# Patient Record
Sex: Female | Born: 1935 | Race: White | Hispanic: No | Smoking: Never smoker
Health system: Southern US, Community
[De-identification: ages and names within clinical notes are randomized; demographics above are authoritative.]

## PROBLEM LIST (undated history)

## (undated) HISTORY — PX: ABDOMINAL HYSTERECTOMY: SHX81

---

## 1997-12-03 ENCOUNTER — Emergency Department (HOSPITAL_COMMUNITY): Admission: EM | Admit: 1997-12-03 | Discharge: 1997-12-03 | Payer: Self-pay | Admitting: Emergency Medicine

## 2002-06-28 ENCOUNTER — Inpatient Hospital Stay (HOSPITAL_COMMUNITY): Admission: EM | Admit: 2002-06-28 | Discharge: 2002-06-29 | Payer: Self-pay | Admitting: Emergency Medicine

## 2002-06-28 ENCOUNTER — Encounter: Payer: Self-pay | Admitting: Emergency Medicine

## 2002-06-28 ENCOUNTER — Encounter: Payer: Self-pay | Admitting: Cardiology

## 2002-06-29 ENCOUNTER — Encounter: Payer: Self-pay | Admitting: Internal Medicine

## 2002-06-29 ENCOUNTER — Encounter: Payer: Self-pay | Admitting: Cardiology

## 2003-02-20 ENCOUNTER — Encounter (INDEPENDENT_AMBULATORY_CARE_PROVIDER_SITE_OTHER): Payer: Self-pay | Admitting: *Deleted

## 2003-02-20 ENCOUNTER — Ambulatory Visit (HOSPITAL_COMMUNITY): Admission: RE | Admit: 2003-02-20 | Discharge: 2003-02-20 | Payer: Self-pay | Admitting: *Deleted

## 2004-09-26 ENCOUNTER — Ambulatory Visit (HOSPITAL_COMMUNITY): Admission: RE | Admit: 2004-09-26 | Discharge: 2004-09-26 | Payer: Self-pay | Admitting: *Deleted

## 2004-09-26 ENCOUNTER — Encounter (INDEPENDENT_AMBULATORY_CARE_PROVIDER_SITE_OTHER): Payer: Self-pay | Admitting: Specialist

## 2006-02-26 ENCOUNTER — Encounter: Admission: RE | Admit: 2006-02-26 | Discharge: 2006-02-26 | Payer: Self-pay

## 2006-08-10 ENCOUNTER — Ambulatory Visit (HOSPITAL_COMMUNITY): Admission: RE | Admit: 2006-08-10 | Discharge: 2006-08-10 | Payer: Self-pay | Admitting: *Deleted

## 2006-08-10 ENCOUNTER — Encounter (INDEPENDENT_AMBULATORY_CARE_PROVIDER_SITE_OTHER): Payer: Self-pay | Admitting: *Deleted

## 2010-06-05 ENCOUNTER — Emergency Department (HOSPITAL_COMMUNITY): Admission: EM | Admit: 2010-06-05 | Discharge: 2010-06-05 | Payer: Self-pay | Admitting: Emergency Medicine

## 2010-11-05 LAB — CBC
HCT: 41.1 % (ref 36.0–46.0)
MCH: 29.7 pg (ref 26.0–34.0)
MCHC: 33.3 g/dL (ref 30.0–36.0)
MCV: 89.2 fL (ref 78.0–100.0)
RDW: 13.8 % (ref 11.5–15.5)

## 2010-11-05 LAB — BASIC METABOLIC PANEL
BUN: 18 mg/dL (ref 6–23)
CO2: 24 mEq/L (ref 19–32)
GFR calc non Af Amer: 57 mL/min — ABNORMAL LOW (ref >60)
Glucose, Bld: 97 mg/dL (ref 70–99)
Potassium: 4.2 mEq/L (ref 3.5–5.1)

## 2010-11-05 LAB — POCT CARDIAC MARKERS: Myoglobin, poc: 62.5 ng/mL (ref 12–200)

## 2011-01-09 NOTE — Op Note (Signed)
   NAMEMARIELLA, Beth Ferguson                            ACCOUNT NO.:  [id number]   MEDICAL RECORD NO.:  [id number]                   PATIENT TYPE:  AMB   LOCATION:  ENDO                                 FACILITY:  [facility]   PHYSICIAN:  Georgiana Spinner, M.D.                 DATE OF BIRTH:  1935-09-08   DATE OF PROCEDURE:  DATE OF DISCHARGE:                                 OPERATIVE REPORT   PROCEDURE:  Upper endoscopy.   INDICATIONS:  Hemoccult positivity.   ANESTHESIA:  Demerol 75 mg, Versed 7.5 mg.   DESCRIPTION OF PROCEDURE:  With the patient mildly sedated in the left  lateral decubitus position, the Olympus videoscopic endoscope was inserted  in the mouth and passed under direct vision through the esophagus which  showed Barrett's esophagus, photographed and biopsied.  There was blood in  the hiatal hernia sac that was bleeding into the stomach.  It was died,  black, but also some seen in the fundus which otherwise appeared normal and  was photographed.  Body and antrum also appeared normal as did the duodenal  bulb and second portion of the duodenum.  From this point, the endoscope was  slowly withdrawn taking circumferential views of the duodenal mucosa until  the endoscope then pulled back into the stomach, placed in retroflexion and  viewed the stomach from below.  Hiatal hernia was seen an incomplete wrap of  the GE junction around the endoscope.  The endoscope was then straightened,  withdrawn, taking circumferential views of the remaining gastric and  esophageal mucosa. The patient's vital signs and pulse oximetry remained  stable.  The patient tolerated the procedure well without apparent  complication.   FINDINGS:  Short segment Barrett's esophagus above a hiatal hernia with  blood seen in the stomach and hiatal hernia sac which very well may be the  cause of the patient's hemoccult positivity.   PLAN:  Await biopsy report.  Consideration for reflux therapy and I will  have  the patient follow up with me for the results of the biopsies as an  outpatient.  Proceed to colonoscopy as planned.                                                Georgiana Spinner, M.D.    GMO/MEDQ  D:  02/20/2003  T:  02/20/2003  Job:  [id number]

## 2011-01-09 NOTE — Op Note (Signed)
Beth Ferguson, Beth Ferguson                ACCOUNT NO.:  [id number]   MEDICAL RECORD NO.:  [id number]          PATIENT TYPE:  AMB   LOCATION:  ENDO                         FACILITY:  [facility]   PHYSICIAN:  Georgiana Spinner, M.D.    DATE OF BIRTH:  01-19-1936   DATE OF PROCEDURE:  08/10/2006  DATE OF DISCHARGE:                               OPERATIVE REPORT   PROCEDURE:  Upper endoscopy.   INDICATIONS:  Gastroesophageal reflux disease.   ANESTHESIA:  Fentanyl 60 mcg, Versed 6 mg.   DESCRIPTION OF PROCEDURE:  With the patient mildly sedated in the left  lateral decubitus position, the Pentax videoscopic endoscope was  inserted in the mouth and passed under direct vision through the  esophagus which appeared normal until we reached the distal esophagus  and there was a question of Barrett's and/or irritation of the distal  esophagus which we attempted to photograph but we did biopsy.  We  entered into the stomach, the fundus, body, antrum, duodenal bulb, and  second portion duodenum were visualized. From this point the endoscope  was slowly withdrawn taking circumferential views of the duodenal mucosa  until the endoscope had been pulled back into the stomach and placed in  retroflexion to view the stomach from below.  A hiatal hernia was noted  and photographed. The endoscope was straightened and withdrawn.  The  patient's vital signs and pulse oximeter remained stable.  The patient  tolerated the procedure well without apparent complications.   FINDINGS:  Small hiatal hernia with some irritation, biopsied.  Await  biopsy report.  The patient will call me for results and follow-up with  me as an outpatient.           ______________________________  Georgiana Spinner, M.D.     GMO/MEDQ  D:  08/10/2006  T:  08/10/2006  Job:  [id number]

## 2011-01-09 NOTE — Op Note (Signed)
Beth Ferguson, Beth Ferguson                ACCOUNT NO.:  [id number]   MEDICAL RECORD NO.:  [id number]          PATIENT TYPE:  AMB   LOCATION:  ENDO                         [facility]AC[facility]  [facility]   PHYSICIAN:  Georgiana Spinner, M.D.    DATE OF BIRTH:  [birth year]   DATE OF PROCEDURE:  09/26/2004  DATE OF DISCHARGE:                                 OPERATIVE REPORT   PROCEDURE:  Upper endoscopy with biopsy.   INDICATIONS FOR PROCEDURE:  Gastroesophageal reflux disease.   ANESTHESIA:  Demerol 50, Versed 5 mg.   DESCRIPTION OF PROCEDURE:  With the patient mildly sedated in the left  lateral decubitus position, the Olympus videoscopic endoscope was inserted  in the mouth, passed under direct vision through the esophagus which  appeared normal until we reached the distal esophagus and there was a  question of Barrett's photographed and biopsied.  We entered into the  stomach. The fundus, body, antrum, duodenal bulb and second portion of the  duodenum were visualized. From this point, the endoscope was slowly  withdrawn taking circumferential views of the duodenal mucosa until the  endoscope had been pulled back in the stomach, placed in retroflexion to  view the stomach from below and a loose wrap of the gastroesophageal  junction around the endoscope was noted. The endoscope was then straightened  and withdrawn taking circumferential views of the remaining gastric and  esophageal mucosa stopping only to biopsy the area of questioned Barrett's.  The patient's vital signs and pulse oximeter remained stable.  The patient  tolerated the procedure well without apparent complications.   FINDINGS:  Question of Barrett's esophagus above a loosely wrapped  gastroesophageal junction.  Await biopsy report. The patient will call me  for results and followup with me as an outpatient and once again the patient  was not taking medication protonix as we suggested and will once again  enforce that with her.      GMO/MEDQ  D:  09/26/2004  T:  09/26/2004  Job:  [id number]

## 2011-01-09 NOTE — H&P (Signed)
Beth Ferguson, Beth Ferguson                            ACCOUNT NO.:  [id number]   MEDICAL RECORD NO.:  [id number]                    PATIENT TYPE:   LOCATION:                                       FACILITY:  [facility]   PHYSICIAN:  Learta Codding, M.D. [facility]             DATE OF BIRTH:  [birth year]   DATE OF ADMISSION:  06/28/2002  DATE OF DISCHARGE:                                HISTORY & PHYSICAL   CARDIOLOGIST:  New -- Dr. Vernie Shanks. [place].   CURRENT COMPLAINT:  Substernal chest pressure and shortness of breath over  the last several weeks, worsened today.   HISTORY OF PRESENT ILLNESS:  The patient is a 75 year old white female with  no significant past medical history, with multiple risk factors for coronary  artery disease.  The patient reports a several-week history of decrease in  exercise tolerance associated with increased chest tightness and shortness  of breath upon exertion.  The patient works at [employer] and when walking from  the parking lot to her office, which is up a ramp, she has been experiencing  increased shortness of breath and chest tightness.  Today, the patient, when  she walked up to her second floor condominium, felt that she was having a  heart attack at the top of the stairs.  She was markedly dyspneic.  After  sitting down for 10 to 15 minutes, her symptoms resolved.  After discussing  this with her daughters, the patient then decided to come to the emergency  room for further evaluation.  In the ER, electrocardiogram is within normal  limits and cardiac enzymes are currently still pending.  The patient denies  any symptoms of orthopnea and PND.  She denies palpitations or syncope.  The  patient's two daughter's are with her and one of her daughters is actually a  former patient of Dr. Duke Salvia, now followed by Dr. Terrilee Croak at  [facility] and in the past also by Dr. Kathe Becton.  Her daughter tells me that  she has a complicated history of atrial fibrillation with  several ablations  and then currently has a pacemaker in place.  She has subsequently requested  our services for her mother's care.   ALLERGIES:  The patient has a severe IVP DYE allergy.   MEDICATIONS:  None.   FAMILY HISTORY:  Family history positive for coronary artery disease; the  patient's brother died at age 60 of myocardial infarction and her father  died at age 38 of a myocardial infarction.  She has a daughter who has  atrial fibrillation and is status post multiple ablations and procedures as  outlined above.   SOCIAL HISTORY:  The patient lives by herself.  She lives in [place].  She works at [employer].  She has a former history of tobacco use; she smoked  about a pack a day between the ages of 36  and 44.   REVIEW OF SYSTEMS:  As per HPI.  No nausea or vomiting.  No fever or chills.  No abdominal pain.  No claudication.  No syncope.   PHYSICAL EXAMINATION:  VITAL SIGNS:  Blood pressure is 145/68.  Heart rate  is 97 beats per minute.  Temperature is 97.9.  GENERAL:  A well-nourished white female in no apparent distress.  HEENT:  Pupils isocoric.  Conjunctivae clear.  NECK:  Neck supple.  Normal carotid upstroke.  No carotid bruits.  LUNGS:  Lungs are clear.  HEART:  Regular rate and rhythm.  Normal S1 and S2.  No murmurs, rubs, or  gallops.  ABDOMEN:  Abdomen is soft and nontender.  No rebound or guarding.  Good  bowel sounds.  EXTREMITIES:  Peripheral pulses 2+.  No cyanosis or clubbing or edema.   LABORATORY AND ACCESSORY CLINICAL DATA:  Twelve-lead EKG:  Normal sinus  rhythm, no acute ischemic changes.   Chest x-ray:  No acute abnormalities.   Labs are currently still pending.   IMPRESSION AND PLAN:  1. Chest tightness with shortness of breath.  The patient has several risk     factors for coronary artery disease.  Her symptoms are rather typical for     exertional angina and I suspect the patient has significant underlying     heart disease.  I had a long  discussion with the patient about her     symptoms and the various ways that we can further evaluate her; this was     also discussed carefully with the patient's daughters and I have     recommended to proceed with a cardiac catheterization to rule out     ischemic heart disease.  I have carefully discussed the risks and     benefits of the procedure and both the patient and her daughters are     comfortable with the risks associated with the cardiac catheterization.     The patient will be admitted and she will be given aspirin and Lovenox as     well as beta blocker therapy.  Plavix will also be added to her medical     regimen.  2. Intravenous pyelogram dye allergy.  The patient will be pretreated with     prednisone, Benadryl and Zantac, pretreatment started at 2 o'clock in the     morning and anticipate the patient should be ready for a cardiac     catheterization after noon.  3. Rule out hypertension.  The patient's blood pressure is elevated.  She     will be started on a beta blocker.  She may need long-term medical     therapy.   DISPOSITION:  Plan for cardiac catheterization later in the afternoon after  complete treatment for prevention of anaphylactoid reaction.                                               Learta Codding, M.D. [facility]    GED/MEDQ  D:  06/28/2002  T:  06/28/2002  Job:  [id number]

## 2011-01-09 NOTE — Op Note (Signed)
   NAMEMALINDA, MAYDEN                            ACCOUNT NO.:  [id number]   MEDICAL RECORD NO.:  [id number]                   PATIENT TYPE:  AMB   LOCATION:  ENDO                                 FACILITY:  [facility]   PHYSICIAN:  Georgiana Spinner, M.D.                 DATE OF BIRTH:  [birth year]   DATE OF PROCEDURE:  02/20/2003  DATE OF DISCHARGE:                                 OPERATIVE REPORT   PROCEDURE:  Colonoscopy.   ENDOSCOPIST:  Georgiana Spinner, M.D.   INDICATIONS FOR PROCEDURE:  The patient is undergoing a colonoscopy because  of Hemoccult positivity.   ANESTHESIA:  None further given.   DESCRIPTION OF PROCEDURE:  With the patient mildly sedated in the left  lateral decubitus position, the Olympus videoscopic colonoscope was inserted  into the rectum and advanced under direct vision to the cecum, identified by  the ileocecal valve and appendiceal orifice, both of which were photographed  from this point.  The colonoscope was slowly withdrawn, taking  circumferential views of the entire colonic mucosa, stopping only in the  rectum where a polyp was seen, photographed, and removed using hot biopsy  forceps technique set at a 20-point blended current.  In retroflexed view  there were internal hemorrhoids seen and photographed.  The endoscope was  straightened and withdrawn.  The patient's vital signs and pulse oximeter remained stable.  The patient  tolerated the procedure well without apparent complications.   FINDINGS:  1. Polyp from rectum, removed.  2. Internal hemorrhoids.   PLAN:  Await the biopsy report.  The patient will call for the results, and  follow up with me as an outpatient.                                                Georgiana Spinner, M.D.    GMO/MEDQ  D:  02/20/2003  T:  02/20/2003  Job:  [id number]

## 2011-01-09 NOTE — Cardiovascular Report (Signed)
NAMEEFRATA, BRUNNER                            ACCOUNT NO.:  [id number]   MEDICAL RECORD NO.:  [id number]                   PATIENT TYPE:  INP   LOCATION:  6529                                 FACILITY:  [facility]   PHYSICIAN:  Jonelle Sidle, M.D. [facility]        DATE OF BIRTH:  [birth year]   DATE OF PROCEDURE:  DATE OF DISCHARGE:                              CARDIAC CATHETERIZATION   PRIMARY CARE PHYSICIAN:  Soyla Murphy. Renne Crigler, M.D.   Corinda Gubler CARDIOLOGIST:  Learta Codding, M.D.   INDICATIONS FOR PROCEDURE:  The patient is a pleasant 75 year old woman with  a history of panic attacks and possible hypertension who presents with a  progressive several-week history of increasing dyspnea on exertion with  intermittent chest pressure.  She has ruled out for myocardial infarction  and following contrast dye prophylaxis given a previous allergy, she is  referred for coronary angiography to clearly define the coronary anatomy.   PROCEDURE PERFORMED:  1. Left heart catheterization.  2. Right heart catheterization.  3. Selective coronary angiography.  4. Left ventriculography.   ACCESS AND EQUIPMENT:  The area about the right femoral artery and vein was  anesthetized with 1% lidocaine and a #6 French sheath was placed in the  right femoral artery via the modified Seldinger technique.  A #8 French  sheath was placed in the right femoral vein via the modified Seldinger  technique.  Standard preformed #6 [place] and JR4 catheters were used for  selective coronary angiography and a #6 French angled pigtail catheter was  used for left heart catheterization and left ventriculography.  A #7 French  balloon-tipped flow-directed catheter was used for right heart  catheterization and hemodynamic assessment.  All exchanges were made over a  wire except for the flow-directed catheter, and the patient tolerated the  procedure well without immediate complications.   HEMODYNAMICS:  1. Right atrium:  13 A  wave, 11 V wave,  9 mean.  2. RV:  26 A wave, 7 V wave, 11 mean.  3. Pulmonary artery:  27 A wave, 14 V wave, 21 mean.  4. Pulmonary capillary wedge pressure:  17 A wave, 14 V wave, 13 mean.  5. Left ventricular:  99/15 mmHg.  6. Aorta:  99/56 mmHg.  7. Cardiac output 5.7 liters/minute.  Cardiac index 3.3.  8. Arterial saturation 95%.  Pulmonary artery saturation 75%.   ANGIOGRAPHIC FINDINGS:  1. The left main coronary artery is free of significant flow-limiting     coronary atherosclerosis.  2. The left anterior descending is a medium caliber vessel that tapers     distally.  There are three small diagonal branches.  There is a large     septal perforator that appears to have a 20% stenosis at its ostium     although this may be simply a developmental finding.  There is no flow-     limiting stenoses within this system.  3. The circumflex coronary artery is  large vessel with seven obtuse     marginal branches.  There is no significant flow-limiting coronary     atherosclerosis within this system.  4. The right coronary artery is a dominant vessel.  There is a 20% ostial     stenosis without any flow-limiting coronary atherosclerosis noted.  5. Left ventriculography is performed in the RAO projection and revealed an     ejection fraction of 70-75% with no focal wall motion abnormalities and     no significant mitral regurgitation.   DIAGNOSES:  1. No flow-limiting coronary atherosclerosis noted within the major     epicardial vessels.  2. Left ventricular ejection fraction of 70-75% without significant mitral     regurgitation.  3. Normal right heart hemodynamics with a normal pulmonary artery pressure,     normal pulmonary capillary wedge pressure, and a normal cardiac output.   RECOMMENDATIONS:  I have discussed the case with Dr. Andee Lineman.  Will plan to  screen the patient with a spiral CT scan of the chest in the morning after  further contrast dye prophylaxis to exclude the  possibilities of pulmonary  embolus or other parenchymal lung disease.                                               Jonelle Sidle, M.D. [facility]    SGM/MEDQ  D:  06/28/2002  T:  06/28/2002  Job:  [id number]

## 2011-01-09 NOTE — Op Note (Signed)
NAMEKHALI, PERELLA                ACCOUNT NO.:  [id number]   MEDICAL RECORD NO.:  [id number]          PATIENT TYPE:  AMB   LOCATION:  ENDO                         FACILITY:  [facility]   PHYSICIAN:  Georgiana Spinner, M.D.    DATE OF BIRTH:  [birth year]   DATE OF PROCEDURE:  DATE OF DISCHARGE:                               OPERATIVE REPORT   PROCEDURE:  Colonoscopy.   INDICATIONS:  Colon polyp.   ANESTHESIA:  Fentanyl 40 mcg, Versed 3 mg.   DESCRIPTION OF PROCEDURE:  With patient mildly sedated in the left  lateral decubitus position the Pentax videoscopic colonoscope was  inserted into the rectum, passed under direct vision with pressure  applied to the abdomen and reached the cecum as identified by ileocecal  valve and appendiceal orifice, both of which were photographed.  From  this point the colonoscope was slowly withdrawn.  Taking circumferential  views of the colonic mucosa stopping in the ascending colon about two  folds removed from the ileocecal valve, which a lipoma was seen and  photographed only.  The endoscope was then withdrawn all the way to  approximately 25 cm from the anal verge at which point a flat polyp was  noted.  This was photographed and it was removed first using snare  cautery technique setting 20/200 blended current.  There was a small  amount of residual polypoid tissue left and using the hot biopsy forceps  with the same setting I removed the remainder of the tissue, placed them  both in the same specimen container.  The endoscope was then withdrawn  to the rectum, which appeared normal on direct and showed hemorrhoids on  retroflex view.  The endoscope was straightened and withdrawn.  The  patient's vital signs and pulse oximetry remained stable.  The patient  tolerated the procedure well without apparent complications.   FINDINGS:  Small polyp at 25 cm from the anal verge, internal  hemorrhoids.  Otherwise an unremarkable examination.   PLAN:  Await  biopsy reports.  The patient will call me for results and  follow up with me as an outpatient.           ______________________________  Georgiana Spinner, M.D.     GMO/MEDQ  D:  08/10/2006  T:  08/11/2006  Job:  [id number]

## 2011-01-09 NOTE — Discharge Summary (Signed)
   Beth Ferguson, Beth Ferguson                            ACCOUNT NO.:  [id number]   MEDICAL RECORD NO.:  [id number]                   PATIENT TYPE:  INP   LOCATION:  4705                                 FACILITY:  [facility]   PHYSICIAN:  Learta Codding, M.D. [facility]             DATE OF BIRTH:  1936/05/18   DATE OF ADMISSION:  06/27/2002  DATE OF DISCHARGE:  06/29/2002                           DISCHARGE SUMMARY - REFERRING   REASON FOR ADMISSION:  Please refer to dictated admission note.   PROCEDURES:  Coronary angiogram on November 5.   LABORATORY DATA:  Normal CBC.  Normal electrolytes and renal function.  Glucose 137 on admission.  BNP less than 30. Cardiac enzymes: Normal x 3.   Admission chest x-ray: No active disease.   HOSPITAL COURSE:  The patient presented with progressive chest discomfort  and exertional dyspnea.  She rule out for myocardial infarction with normal  serial cardiac enzymes.  Recommendation was, however, to proceed with  coronary angiography to exclude significant underlying coronary artery  disease.  Of note, the patient reported history of severe IVP DYE allergy  requiring pretreatment prior to the catheterization.   Coronary angiography performed by Dr. Simona Huh (see report for full  details) revealed no flow-limiting coronary artery disease with normal left  ventricle (EF 70 to 75%) and no __________  .  Specifically, there was  normal LMCA, 20% septal perforator with normal LAD, normal circumflex, 20%  ostial RCA.   This was followed by CT scan of the chest which was negative for pulmonary  embolus.   Additionally, the patient had a 2-D echocardiogram suggestive of diastolic  dysfunction with no significant valvular abnormalities.   Dr. Andee Lineman concluded that the patient had probable exercise-induced asthma,  and no further cardiac workup was recommended.   DISCHARGE MEDICATIONS:  None.   DISCHARGE INSTRUCTIONS:  The patient was instructed to follow up with  Dr.  Lewayne Bunting with arrangements made through our office.   DISCHARGE DIAGNOSES:  1. Nonischemic chest pain.     a. Nonobstructive coronary artery disease with normal left ventricle by        coronary angiogram 06/28/2002.     b. Question exercise-induced asthma.  2. IVP DYE allergy.  3.     History of tobacco.  4. Significant family history of coronary artery disease.  5. History of anxiety disorder.     Gene Serpe, P.A. [facility]                      Learta Codding, M.D. [facility]    GS/MEDQ  D:  07/20/2002  T:  07/20/2002  Job:  [id number]

## 2011-05-20 ENCOUNTER — Ambulatory Visit (HOSPITAL_COMMUNITY)
Admission: RE | Admit: 2011-05-20 | Discharge: 2011-05-20 | Disposition: A | Discharge status: Home / Self Care | Payer: Medicare Other | Source: Ambulatory Visit | Attending: Internal Medicine | Admitting: Internal Medicine

## 2011-05-20 DIAGNOSIS — M79609 Pain in unspecified limb: Secondary | ICD-10-CM

## 2011-05-20 DIAGNOSIS — I8390 Asymptomatic varicose veins of unspecified lower extremity: Secondary | ICD-10-CM | POA: Insufficient documentation

## 2011-05-20 DIAGNOSIS — M7989 Other specified soft tissue disorders: Secondary | ICD-10-CM

## 2013-07-16 ENCOUNTER — Emergency Department (HOSPITAL_BASED_OUTPATIENT_CLINIC_OR_DEPARTMENT_OTHER)
Admission: EM | Admit: 2013-07-16 | Discharge: 2013-07-16 | Disposition: A | Discharge status: Home / Self Care | Payer: Medicare Other | Attending: Emergency Medicine | Admitting: Emergency Medicine

## 2013-07-16 ENCOUNTER — Encounter (HOSPITAL_BASED_OUTPATIENT_CLINIC_OR_DEPARTMENT_OTHER): Payer: Self-pay | Admitting: Emergency Medicine

## 2013-07-16 ENCOUNTER — Emergency Department (HOSPITAL_BASED_OUTPATIENT_CLINIC_OR_DEPARTMENT_OTHER): Payer: Medicare Other

## 2013-07-16 DIAGNOSIS — S82899A Other fracture of unspecified lower leg, initial encounter for closed fracture: Secondary | ICD-10-CM | POA: Insufficient documentation

## 2013-07-16 DIAGNOSIS — Y9301 Activity, walking, marching and hiking: Secondary | ICD-10-CM | POA: Insufficient documentation

## 2013-07-16 DIAGNOSIS — W010XXA Fall on same level from slipping, tripping and stumbling without subsequent striking against object, initial encounter: Secondary | ICD-10-CM | POA: Insufficient documentation

## 2013-07-16 DIAGNOSIS — S93409A Sprain of unspecified ligament of unspecified ankle, initial encounter: Secondary | ICD-10-CM | POA: Insufficient documentation

## 2013-07-16 DIAGNOSIS — S82891A Other fracture of right lower leg, initial encounter for closed fracture: Secondary | ICD-10-CM

## 2013-07-16 DIAGNOSIS — Y929 Unspecified place or not applicable: Secondary | ICD-10-CM | POA: Insufficient documentation

## 2013-07-16 DIAGNOSIS — S93401A Sprain of unspecified ligament of right ankle, initial encounter: Secondary | ICD-10-CM

## 2013-07-16 DIAGNOSIS — W108XXA Fall (on) (from) other stairs and steps, initial encounter: Secondary | ICD-10-CM | POA: Insufficient documentation

## 2013-07-16 MED ORDER — HYDROCODONE-ACETAMINOPHEN 5-325 MG PO TABS
1.0000 | ORAL_TABLET | Freq: Once | ORAL | Status: AC
  Administered 2013-07-16: 1 via ORAL
  Filled 2013-07-16: qty 1

## 2013-07-16 MED ORDER — HYDROCODONE-ACETAMINOPHEN 5-325 MG PO TABS: 1.0000 | ORAL_TABLET | ORAL | Status: AC | PRN

## 2013-07-16 NOTE — ED Provider Notes (Signed)
CSN: [id number]     Arrival date & time 07/16/13  1331 History   First MD Initiated Contact with Patient 07/16/13 1353     Chief Complaint  Patient presents with  . Ankle Injury   (Consider location/radiation/quality/duration/timing/severity/associated sxs/prior Treatment) HPI Comments: Patient presents with bilateral ankle pain. She states last night about midnight she was walking down some stairs or rash and slipped on a step and fell onto her ankles. She has pain in both of her ankles. She has been able to walk on but it's limited due to her pain. She denies any other injuries. She denies any head injury or loss of consciousness. She denies any neck or back pain. She's had constant throbbing pain to both of her ankles since the fall.  Patient is a 77 y.o. female presenting with lower extremity injury.  Ankle Injury Pertinent negatives include no headaches.    History reviewed. No pertinent past medical history. History reviewed. No pertinent past surgical history. No family history on file. History  Substance Use Topics  . Smoking status: Never Smoker   . Smokeless tobacco: Not on file  . Alcohol Use: Not on file   OB History   Grav Para Term Preterm Abortions TAB SAB Ect Mult Living                 Review of Systems  Constitutional: Negative for fever.  Gastrointestinal: Negative for nausea and vomiting.  Musculoskeletal: Positive for arthralgias and joint swelling. Negative for back pain and neck pain.  Skin: Negative for wound.  Neurological: Negative for weakness, numbness and headaches.    Allergies  Ivp dye  Home Medications   Current Outpatient Rx  Name  Route  Sig  Dispense  Refill  . HYDROcodone-acetaminophen (NORCO/VICODIN) 5-325 MG per tablet   Oral   Take 1 tablet by mouth every 4 (four) hours as needed.   20 tablet   0    BP 119/64  Pulse 81  Temp(Src) 98.4 F (36.9 C) (Oral)  Resp 18  Wt 180 lb (81.647 kg)  SpO2 100% Physical Exam   Constitutional: She is oriented to person, place, and time. She appears well-developed and well-nourished.  HENT:  Head: Normocephalic and atraumatic.  Neck: Normal range of motion. Neck supple.  Cardiovascular: Normal rate.   Pulmonary/Chest: Effort normal.  Musculoskeletal: She exhibits edema and tenderness.  Patient swelling to both of her ankles. She has more swelling on the right ankle with ecchymosis around the lateral malleolus of the dorsum of the right foot. There is tenderness along the lateral malleolus of both ankles but no significant bony tenderness to the feet. There is some tenderness to the left proximal fibula but not on the right. There is no pain to the knees. No pain to the hips. She has normal pulses in the feet. She has normal sensation in the feet. Normal motor function in the feet.  Neurological: She is alert and oriented to person, place, and time.  Skin: Skin is warm and dry.  Psychiatric: She has a normal mood and affect.    ED Course  Procedures (including critical care time) Labs Review Labs Reviewed - No data to display Imaging Review Dg Tibia/fibula Left  07/16/2013   CLINICAL DATA:  Larey Seat last night with pain laterally and anteriorly  EXAM: LEFT TIBIA AND FIBULA - 2 VIEW  COMPARISON:  None.  FINDINGS: Lateral and anterior soft tissue swelling at the level of the ankle. No fracture or dislocation involving  the tibia or fibula.  IMPRESSION: No fractures.   Electronically Signed   By: Esperanza Heir M.D.   On: 07/16/2013 14:55   Dg Ankle Complete Left  07/16/2013   CLINICAL DATA:  Larey Seat last night and injured ankle, pain anteriorly and laterally  EXAM: LEFT ANKLE COMPLETE - 3+ VIEW  COMPARISON:  None.  FINDINGS: There is lateral soft tissue swelling. The mortise is intact. No fracture identified. No joint effusion appreciated. Soft tissue swelling also seen anteriorly.  IMPRESSION: Findings consistent with sprain   Electronically Signed   By: Esperanza Heir  M.D.   On: 07/16/2013 14:55   Dg Ankle Complete Right  07/16/2013   CLINICAL DATA:  Right ankle pain after fall.  EXAM: RIGHT ANKLE - COMPLETE 3+ VIEW  COMPARISON:  None.  FINDINGS: Small crescent shaped bone fragment is seen arising from the inferior portion of the tibia concerning for avulsion fracture. Soft tissue swelling is seen over lateral malleolus. Joint spaces are intact.  IMPRESSION: Probable small avulsion fracture involving distal fibula. Overlying soft tissue swelling is noted.   Electronically Signed   By: Roque Lias M.D.   On: 07/16/2013 14:55    EKG Interpretation   None       MDM   1. Ankle sprain and strain, left, initial encounter   2. Ankle sprain, right, initial encounter   3. Avulsion fracture of ankle, right, closed, initial encounter    Patient placed in bilateral ankle Velcro splint. She was advised in ice and elevation. She is going to get a walker to use at home. She was given a prescription for Vicodin to use for pain. She has an orthopedist to followup with with [facility]    Rolan Bucco, MD 07/16/13 [phone]

## 2013-07-16 NOTE — ED Notes (Signed)
Patient here with bilateral ankle pain after falling down steps last pm, denies loc. Pain with any ambulation

## 2014-12-27 DIAGNOSIS — K047 Periapical abscess without sinus: Secondary | ICD-10-CM | POA: Diagnosis not present

## 2015-02-10 DIAGNOSIS — L03115 Cellulitis of right lower limb: Secondary | ICD-10-CM | POA: Diagnosis not present

## 2015-02-10 DIAGNOSIS — W57XXXA Bitten or stung by nonvenomous insect and other nonvenomous arthropods, initial encounter: Secondary | ICD-10-CM | POA: Diagnosis not present

## 2015-03-29 DIAGNOSIS — T148 Other injury of unspecified body region: Secondary | ICD-10-CM | POA: Diagnosis not present

## 2017-06-11 DIAGNOSIS — H25813 Combined forms of age-related cataract, bilateral: Secondary | ICD-10-CM | POA: Diagnosis not present

## 2017-06-28 DIAGNOSIS — H25811 Combined forms of age-related cataract, right eye: Secondary | ICD-10-CM | POA: Diagnosis not present

## 2017-06-28 DIAGNOSIS — H25812 Combined forms of age-related cataract, left eye: Secondary | ICD-10-CM | POA: Diagnosis not present

## 2017-06-28 DIAGNOSIS — H04123 Dry eye syndrome of bilateral lacrimal glands: Secondary | ICD-10-CM | POA: Diagnosis not present

## 2017-06-28 DIAGNOSIS — H353131 Nonexudative age-related macular degeneration, bilateral, early dry stage: Secondary | ICD-10-CM | POA: Diagnosis not present

## 2017-07-21 DIAGNOSIS — H25812 Combined forms of age-related cataract, left eye: Secondary | ICD-10-CM | POA: Diagnosis not present

## 2017-07-21 DIAGNOSIS — H2512 Age-related nuclear cataract, left eye: Secondary | ICD-10-CM | POA: Diagnosis not present

## 2017-09-09 DIAGNOSIS — H2511 Age-related nuclear cataract, right eye: Secondary | ICD-10-CM | POA: Diagnosis not present

## 2017-09-09 DIAGNOSIS — H25811 Combined forms of age-related cataract, right eye: Secondary | ICD-10-CM | POA: Diagnosis not present

## 2017-10-20 DIAGNOSIS — I8003 Phlebitis and thrombophlebitis of superficial vessels of lower extremities, bilateral: Secondary | ICD-10-CM | POA: Diagnosis not present

## 2017-10-20 DIAGNOSIS — J329 Chronic sinusitis, unspecified: Secondary | ICD-10-CM | POA: Diagnosis not present

## 2017-10-20 DIAGNOSIS — Z7689 Persons encountering health services in other specified circumstances: Secondary | ICD-10-CM | POA: Diagnosis not present

## 2017-10-20 DIAGNOSIS — R609 Edema, unspecified: Secondary | ICD-10-CM | POA: Diagnosis not present

## 2017-11-10 DIAGNOSIS — E785 Hyperlipidemia, unspecified: Secondary | ICD-10-CM | POA: Diagnosis not present

## 2017-11-10 DIAGNOSIS — N390 Urinary tract infection, site not specified: Secondary | ICD-10-CM | POA: Diagnosis not present

## 2017-11-10 DIAGNOSIS — Z0000 Encounter for general adult medical examination without abnormal findings: Secondary | ICD-10-CM | POA: Diagnosis not present

## 2017-11-10 DIAGNOSIS — N189 Chronic kidney disease, unspecified: Secondary | ICD-10-CM | POA: Diagnosis not present

## 2017-11-17 DIAGNOSIS — I8003 Phlebitis and thrombophlebitis of superficial vessels of lower extremities, bilateral: Secondary | ICD-10-CM | POA: Diagnosis not present

## 2017-11-17 DIAGNOSIS — N189 Chronic kidney disease, unspecified: Secondary | ICD-10-CM | POA: Diagnosis not present

## 2017-11-17 DIAGNOSIS — R609 Edema, unspecified: Secondary | ICD-10-CM | POA: Diagnosis not present

## 2017-11-17 DIAGNOSIS — E785 Hyperlipidemia, unspecified: Secondary | ICD-10-CM | POA: Diagnosis not present

## 2017-12-16 DIAGNOSIS — R5383 Other fatigue: Secondary | ICD-10-CM | POA: Diagnosis not present

## 2017-12-16 DIAGNOSIS — N183 Chronic kidney disease, stage 3 (moderate): Secondary | ICD-10-CM | POA: Diagnosis not present

## 2017-12-16 DIAGNOSIS — E785 Hyperlipidemia, unspecified: Secondary | ICD-10-CM | POA: Diagnosis not present

## 2017-12-16 DIAGNOSIS — I8003 Phlebitis and thrombophlebitis of superficial vessels of lower extremities, bilateral: Secondary | ICD-10-CM | POA: Diagnosis not present

## 2018-03-07 DIAGNOSIS — M79601 Pain in right arm: Secondary | ICD-10-CM | POA: Diagnosis not present

## 2018-07-29 ENCOUNTER — Other Ambulatory Visit: Payer: Self-pay

## 2018-07-29 ENCOUNTER — Emergency Department (HOSPITAL_BASED_OUTPATIENT_CLINIC_OR_DEPARTMENT_OTHER): Payer: Medicare Other

## 2018-07-29 ENCOUNTER — Encounter (HOSPITAL_BASED_OUTPATIENT_CLINIC_OR_DEPARTMENT_OTHER): Payer: Self-pay | Admitting: *Deleted

## 2018-07-29 ENCOUNTER — Emergency Department (HOSPITAL_BASED_OUTPATIENT_CLINIC_OR_DEPARTMENT_OTHER)
Admission: EM | Admit: 2018-07-29 | Discharge: 2018-07-29 | Disposition: A | Discharge status: Home / Self Care | Payer: Medicare Other | Attending: Emergency Medicine | Admitting: Emergency Medicine

## 2018-07-29 DIAGNOSIS — Y939 Activity, unspecified: Secondary | ICD-10-CM | POA: Insufficient documentation

## 2018-07-29 DIAGNOSIS — R0600 Dyspnea, unspecified: Secondary | ICD-10-CM | POA: Diagnosis not present

## 2018-07-29 DIAGNOSIS — R0781 Pleurodynia: Secondary | ICD-10-CM | POA: Diagnosis not present

## 2018-07-29 DIAGNOSIS — Y92009 Unspecified place in unspecified non-institutional (private) residence as the place of occurrence of the external cause: Secondary | ICD-10-CM | POA: Insufficient documentation

## 2018-07-29 DIAGNOSIS — W19XXXA Unspecified fall, initial encounter: Secondary | ICD-10-CM | POA: Insufficient documentation

## 2018-07-29 DIAGNOSIS — Y999 Unspecified external cause status: Secondary | ICD-10-CM | POA: Insufficient documentation

## 2018-07-29 DIAGNOSIS — S299XXA Unspecified injury of thorax, initial encounter: Secondary | ICD-10-CM | POA: Diagnosis not present

## 2018-07-29 DIAGNOSIS — S20211A Contusion of right front wall of thorax, initial encounter: Secondary | ICD-10-CM

## 2018-07-29 LAB — BASIC METABOLIC PANEL
Anion gap: 5 (ref 5–15)
BUN: 20 mg/dL (ref 8–23)
CHLORIDE: 106 mmol/L (ref 98–111)
CO2: 27 mmol/L (ref 22–32)
Calcium: 8.4 mg/dL — ABNORMAL LOW (ref 8.9–10.3)
Creatinine, Ser: 1.12 mg/dL — ABNORMAL HIGH (ref 0.44–1.00)
GFR calc Af Amer: 54 mL/min — ABNORMAL LOW (ref >60)
GFR calc non Af Amer: 46 mL/min — ABNORMAL LOW (ref >60)
GLUCOSE: 94 mg/dL (ref 70–99)
POTASSIUM: 3.9 mmol/L (ref 3.5–5.1)
Sodium: 138 mmol/L (ref 135–145)

## 2018-07-29 LAB — CBC WITH DIFFERENTIAL/PLATELET
ABS IMMATURE GRANULOCYTES: 0.01 10*3/uL (ref 0.00–0.07)
Basophils Absolute: 0 10*3/uL (ref 0.0–0.1)
Basophils Relative: 1 %
Eosinophils Absolute: 0.2 10*3/uL (ref 0.0–0.5)
Eosinophils Relative: 3 %
HCT: 33.9 % — ABNORMAL LOW (ref 36.0–46.0)
HEMOGLOBIN: 10.6 g/dL — AB (ref 12.0–15.0)
Immature Granulocytes: 0 %
LYMPHS PCT: 12 %
Lymphs Abs: 0.8 10*3/uL (ref 0.7–4.0)
MCH: 29 pg (ref 26.0–34.0)
MCHC: 31.3 g/dL (ref 30.0–36.0)
MCV: 92.9 fL (ref 80.0–100.0)
MONO ABS: 0.6 10*3/uL (ref 0.1–1.0)
MONOS PCT: 9 %
NEUTROS ABS: 4.7 10*3/uL (ref 1.7–7.7)
Neutrophils Relative %: 75 %
Platelets: 242 10*3/uL (ref 150–400)
RBC: 3.65 MIL/uL — ABNORMAL LOW (ref 3.87–5.11)
RDW: 13.6 % (ref 11.5–15.5)
WBC: 6.2 10*3/uL (ref 4.0–10.5)
nRBC: 0 % (ref 0.0–0.2)

## 2018-07-29 LAB — TROPONIN I: Troponin I: <0.03 ng/mL (ref <0.03)

## 2018-07-29 LAB — BRAIN NATRIURETIC PEPTIDE: B Natriuretic Peptide: 84 pg/mL (ref 0.0–100.0)

## 2018-07-29 MED ORDER — METHOCARBAMOL 500 MG PO TABS: 500.0000 mg | ORAL_TABLET | Freq: Two times a day (BID) | ORAL | 0 refills | Status: AC

## 2018-07-29 MED ORDER — LIDOCAINE 4 % EX PTCH: 1.0000 | MEDICATED_PATCH | Freq: Two times a day (BID) | CUTANEOUS | 0 refills | Status: AC

## 2018-07-29 MED ORDER — LIDOCAINE VISCOUS HCL 2 % MT SOLN: 15.0000 mL | Freq: Once | OROMUCOSAL | Status: DC

## 2018-07-29 MED ORDER — LIDOCAINE 5 % EX PTCH
1.0000 | MEDICATED_PATCH | CUTANEOUS | Status: DC
  Filled 2018-07-29: qty 1

## 2018-07-29 MED ORDER — IBUPROFEN 400 MG PO TABS
400.0000 mg | ORAL_TABLET | Freq: Once | ORAL | Status: AC
  Administered 2018-07-29: 400 mg via ORAL
  Filled 2018-07-29: qty 1

## 2018-07-29 MED ORDER — ACETAMINOPHEN ER 650 MG PO TBCR: 650.0000 mg | EXTENDED_RELEASE_TABLET | Freq: Three times a day (TID) | ORAL | 0 refills | Status: AC

## 2018-07-29 MED ORDER — ACETAMINOPHEN 325 MG PO TABS
650.0000 mg | ORAL_TABLET | Freq: Once | ORAL | Status: AC
  Administered 2018-07-29: 650 mg via ORAL
  Filled 2018-07-29: qty 2

## 2018-07-29 MED ORDER — IBUPROFEN 400 MG PO TABS: 400.0000 mg | ORAL_TABLET | Freq: Three times a day (TID) | ORAL | 0 refills | Status: AC

## 2018-07-29 NOTE — Discharge Instructions (Addendum)
We saw in the ER for shortness of breath and chest discomfort. The x-ray of your ribs does not show any evidence of fracture.  It is possible that you might have a hairline fracture of the rib that should heal by itself over the next 4 weeks or so.  We suspect that you likely have a rib contusion, and it should heal on its own.  Use the incentive spirometer provided every 15 minutes to ensure you do not end up with a pneumonia.  Take the medications prescribed for your rib pain.  We are not sure what is causing her shortness of breath.   There is no evidence of heart attack or heart failure on her work-up.   You do not want to be tested for blood clot at this time, therefore see your primary doctor in 1 week.  If your symptoms get worse return to the ER.

## 2018-07-29 NOTE — ED Provider Notes (Signed)
[facility] EMERGENCY DEPARTMENT Provider Note   CSN: [id number] Arrival date & time: 07/29/18  1450     History   Chief Complaint Chief Complaint  Patient presents with  . Fall    HPI Beth Ferguson is a 82 y.o. female.  HPI  82 year old female comes in with chief complaint of fall. Patient had a fall 2 weeks ago at home.  She fell onto her right side and has been having right-sided rib pain since then.  Patient went on with a cruise and arrived back to [facility] yesterday.  Whilst on cruise patient started noticing that she was getting short of breath with exertion.  Patient denies any chest pain, cough, fevers, chills. Pt has no hx of PE, DVT and denies any exogenous hormone (testosterone / estrogen) use, long distance travels or surgery in the past 6 weeks, active cancer, recent immobilization.  History reviewed. No pertinent past medical history.  There are no active problems to display for this patient.   Past Surgical History:  Procedure Laterality Date  . ABDOMINAL HYSTERECTOMY       OB History   None      Home Medications    Prior to Admission medications   Medication Sig Start Date End Date Taking? Authorizing Provider  acetaminophen (TYLENOL 8 HOUR) 650 MG CR tablet Take 1 tablet (650 mg total) by mouth every 8 (eight) hours. 07/29/18   Derwood Kaplan, MD  HYDROcodone-acetaminophen (NORCO/VICODIN) 5-325 MG per tablet Take 1 tablet by mouth every 4 (four) hours as needed. 07/16/13   Rolan Bucco, MD  ibuprofen (ADVIL,MOTRIN) 400 MG tablet Take 1 tablet (400 mg total) by mouth 3 (three) times daily. 07/29/18   Derwood Kaplan, MD  Lidocaine 4 % PTCH Apply 1 patch topically 2 (two) times daily. 07/29/18   Derwood Kaplan, MD  methocarbamol (ROBAXIN) 500 MG tablet Take 1 tablet (500 mg total) by mouth 2 (two) times daily. 07/29/18   Derwood Kaplan, MD    Family History No family history on file.  Social History Social History   Tobacco Use    . Smoking status: Never Smoker  Substance Use Topics  . Alcohol use: Not Currently  . Drug use: Not Currently     Allergies   Ivp dye [iodinated diagnostic agents]   Review of Systems Review of Systems  Constitutional: Positive for activity change.  Respiratory: Positive for shortness of breath.   Cardiovascular: Positive for chest pain.  Musculoskeletal: Positive for back pain.  Skin: Positive for rash and wound.  Hematological: Does not bruise/bleed easily.     Physical Exam Updated Vital Signs BP 130/70 (BP Location: Left Arm)   Pulse 78   Temp 98.2 F (36.8 C) (Oral)   Resp 16   Ht 5' (1.524 m)   Wt 81.6 kg   SpO2 100%   BMI 35.15 kg/m   Physical Exam  Constitutional: She is oriented to person, place, and time. She appears well-developed.  HENT:  Head: Normocephalic and atraumatic.  Eyes: EOM are normal.  Neck: Normal range of motion. Neck supple.  Cardiovascular: Normal rate.  Pulmonary/Chest: Effort normal.  Abdominal: Bowel sounds are normal.  Neurological: She is alert and oriented to person, place, and time.  Skin: Skin is warm and dry. Rash noted.  Right lower thoracic region has a large area of ecchymosis.  Nursing note and vitals reviewed.    ED Treatments / Results  Labs (all labs ordered are listed, but only abnormal results are  displayed) Labs Reviewed  CBC WITH DIFFERENTIAL/PLATELET - Abnormal; Notable for the following components:      Result Value   RBC 3.65 (*)    Hemoglobin 10.6 (*)    HCT 33.9 (*)    All other components within normal limits  BASIC METABOLIC PANEL - Abnormal; Notable for the following components:   Creatinine, Ser 1.12 (*)    Calcium 8.4 (*)    GFR calc non Af Amer 46 (*)    GFR calc Af Amer 54 (*)    All other components within normal limits  BRAIN NATRIURETIC PEPTIDE  TROPONIN I    EKG EKG Interpretation  Date/Time:  Friday July 29 2018 16:14:53 EST Ventricular Rate:  84 PR Interval:    QRS  Duration: 92 QT Interval:  367 QTC Calculation: 434 R Axis:   5 Text Interpretation:  Sinus rhythm No acute changes No significant change since last tracing Confirmed by Derwood Kaplan [phone]) on 07/29/2018 5:41:43 PM   Radiology Dg Ribs Unilateral W/chest Right  Result Date: 07/29/2018 CLINICAL DATA:  Right rib pain after fall 2 weeks ago. EXAM: RIGHT RIBS AND CHEST - 3+ VIEW COMPARISON:  Radiographs of July 29, 2018. FINDINGS: No fracture or other bone lesions are seen involving the ribs. There is no evidence of pneumothorax or pleural effusion. Both lungs are clear. Heart size and mediastinal contours are within normal limits. Large hiatal hernia is noted. IMPRESSION: Normal right ribs.  Large hiatal hernia. Electronically Signed   By: Lupita Raider, M.D.   On: 07/29/2018 15:27    Procedures Procedures (including critical care time)  Medications Ordered in ED Medications  lidocaine (LIDODERM) 5 % 1 patch (1 patch Transdermal Not Given 07/29/18 1623)  acetaminophen (TYLENOL) tablet 650 mg (650 mg Oral Given 07/29/18 1648)  ibuprofen (ADVIL,MOTRIN) tablet 400 mg (400 mg Oral Given 07/29/18 1648)     Initial Impression / Assessment and Plan / ED Course  I have reviewed the triage vital signs and the nursing notes.  Pertinent labs & imaging results that were available during my care of the patient were reviewed by me and considered in my medical decision making (see chart for details).  Clinical Course as of Jul 29 1920  Fri Jul 29, 2018  6045 Results from the ER workup discussed with the patient face to face and all questions answered to the best of my ability.  Strict ER return precautions have been discussed, and patient is agreeing with the plan and is comfortable with the workup done and the recommendations from the ER.    [AN]    Clinical Course User Index [AN] Derwood Kaplan, MD    82 year old female comes in with chief complaint of right-sided lower chest pain and  shortness of breath.  She is a healthy woman with no history of blood clots, coronary artery disease, CHF.  Patient had a fall few days ago which resulted in ecchymosis on the right lower thoracic region posteriorly.  Patient continues to have significant pain in that area now 2 weeks after the injury.  She returned from her cruise and had an x-ray done which was negative for fracture, but patient was advised to come to the ER for further work-up of the pain continues.  On my exam her lung exam is completely clear.  Patient does not have any pneumothorax on the x-ray.  Rib x-ray does not reveal any large displaced fractures.  It is possible that she has rib contusions versus hairline  fracture.  The other possibility includes hematoma and contusion of the soft tissue.  Patient is not having any cough or fever.  Lungs are clear we do not think she has a pneumonia.  Troponin and BNP ordered because of exertional dyspnea which is also new.  Both results are normal.  She had an 8 Hour Dr. back and forth from [place] within the last few weeks, we discussed the possibility of blood clot as a reason for her shortness of breath.  At this time patient does not want us to test her for blood clot.  She will follow-up with her PCP if her symptoms are not getting worse.  Patient understands the risk and benefit of aggressive work-up in the ER to screen for blood clot, and she is comfortable going home for now and coming into the ER if her symptoms are getting worse.  Final Clinical Impressions(s) / ED Diagnoses   Final diagnoses:  Contusion of rib on right side, initial encounter  Dyspnea, unspecified type    ED Discharge Orders         Ordered    ibuprofen (ADVIL,MOTRIN) 400 MG tablet  3 times daily     07/29/18 1746    acetaminophen (TYLENOL 8 HOUR) 650 MG CR tablet  Every 8 hours     07/29/18 1746    methocarbamol (ROBAXIN) 500 MG tablet  2 times daily     07/29/18 1746    Lidocaine 4 % PTCH  2 times daily      07/29/18 1746           Derwood KaplanNanavati, Charletta Voight, MD 07/29/18 1921

## 2018-07-29 NOTE — ED Notes (Signed)
ED Provider at bedside. 

## 2018-07-29 NOTE — ED Triage Notes (Signed)
Pt c/o fall 2 weeks ago  With right rib injury , SOB 1 week

## 2018-10-24 DIAGNOSIS — M25571 Pain in right ankle and joints of right foot: Secondary | ICD-10-CM | POA: Diagnosis not present

## 2018-10-24 DIAGNOSIS — Y92009 Unspecified place in unspecified non-institutional (private) residence as the place of occurrence of the external cause: Secondary | ICD-10-CM | POA: Diagnosis not present

## 2018-10-24 DIAGNOSIS — W19XXXA Unspecified fall, initial encounter: Secondary | ICD-10-CM | POA: Diagnosis not present

## 2018-10-24 DIAGNOSIS — S8011XA Contusion of right lower leg, initial encounter: Secondary | ICD-10-CM | POA: Diagnosis not present

## 2018-11-09 ENCOUNTER — Ambulatory Visit
Admission: RE | Admit: 2018-11-09 | Discharge: 2018-11-09 | Disposition: A | Discharge status: Home / Self Care | Payer: Medicare Other | Source: Ambulatory Visit | Attending: Internal Medicine | Admitting: Internal Medicine

## 2018-11-09 ENCOUNTER — Other Ambulatory Visit: Payer: Self-pay | Admitting: Internal Medicine

## 2018-11-09 DIAGNOSIS — S81801A Unspecified open wound, right lower leg, initial encounter: Secondary | ICD-10-CM | POA: Diagnosis not present

## 2018-11-09 DIAGNOSIS — M7989 Other specified soft tissue disorders: Secondary | ICD-10-CM | POA: Diagnosis not present

## 2018-11-09 DIAGNOSIS — R609 Edema, unspecified: Secondary | ICD-10-CM | POA: Diagnosis not present

## 2018-11-09 DIAGNOSIS — I8003 Phlebitis and thrombophlebitis of superficial vessels of lower extremities, bilateral: Secondary | ICD-10-CM | POA: Diagnosis not present

## 2018-11-09 DIAGNOSIS — R600 Localized edema: Secondary | ICD-10-CM | POA: Diagnosis not present

## 2018-11-21 DIAGNOSIS — T148XXA Other injury of unspecified body region, initial encounter: Secondary | ICD-10-CM | POA: Diagnosis not present

## 2019-03-01 IMAGING — DX DG RIBS W/ CHEST 3+V*R*
3 series · 4 of 4 positions shown · non-contrast
Comparison: Radiographs July 29, 2018.

CLINICAL DATA: Right rib pain after fall 2 weeks ago.

EXAM:
RIGHT RIBS AND CHEST - 3+ VIEW

[chest pa]
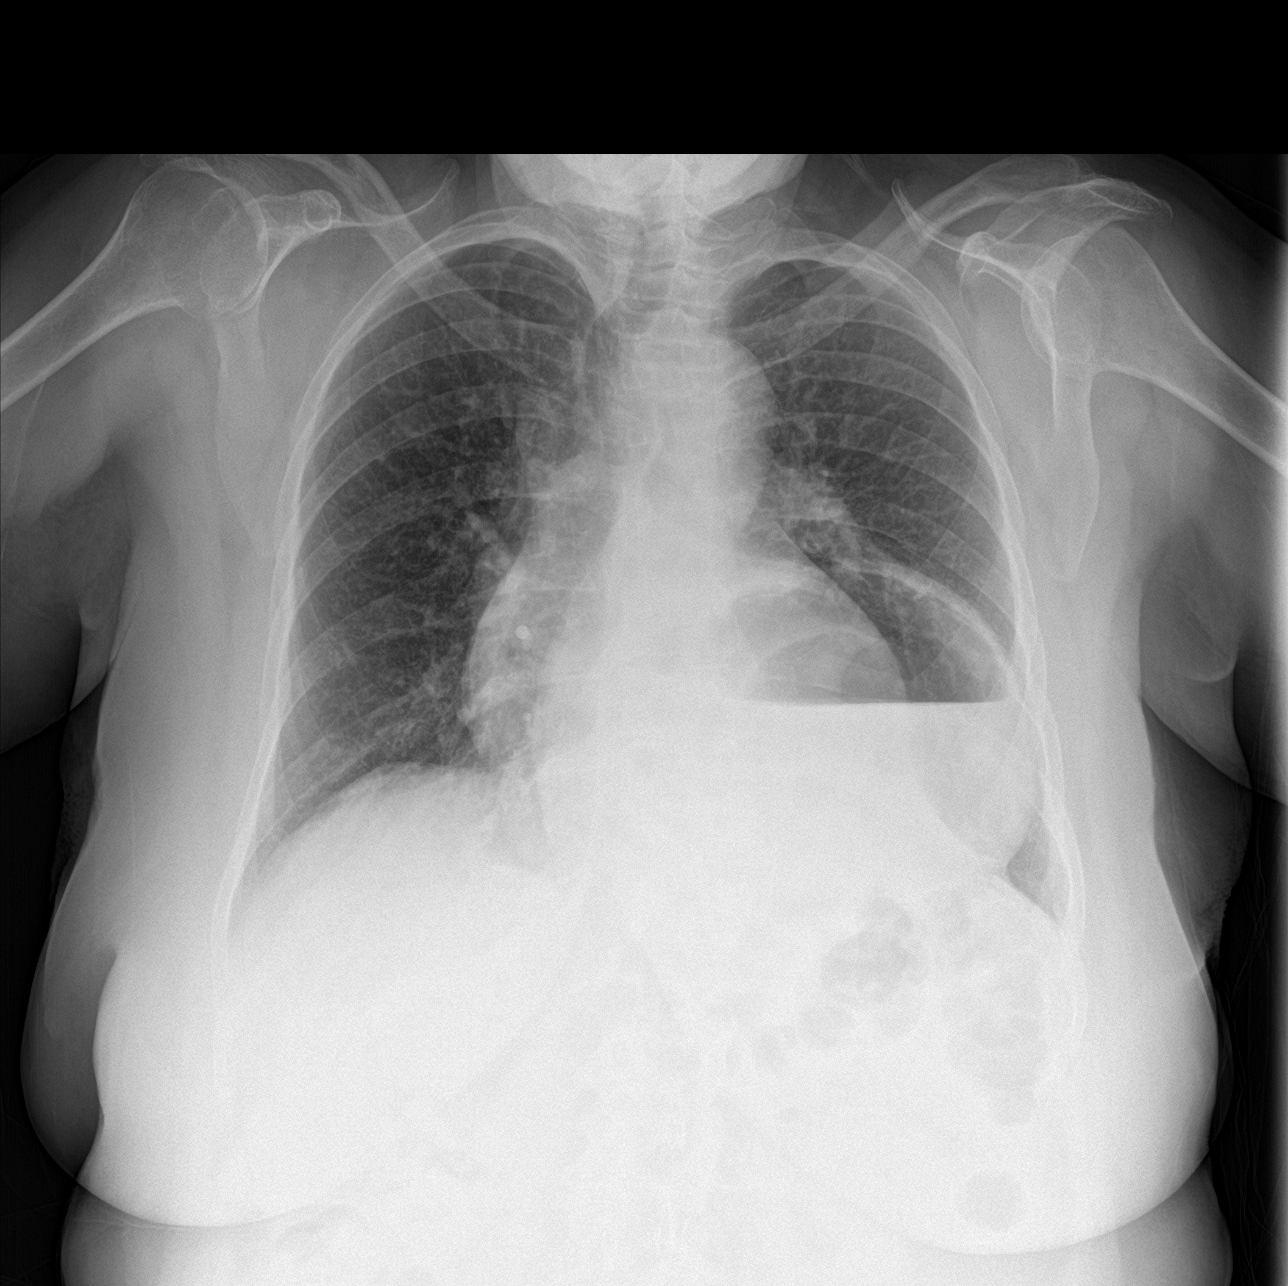

[Series 2: rib pa · 0.14mm/px · 2 of 2 slices shown]
[im 1/2]
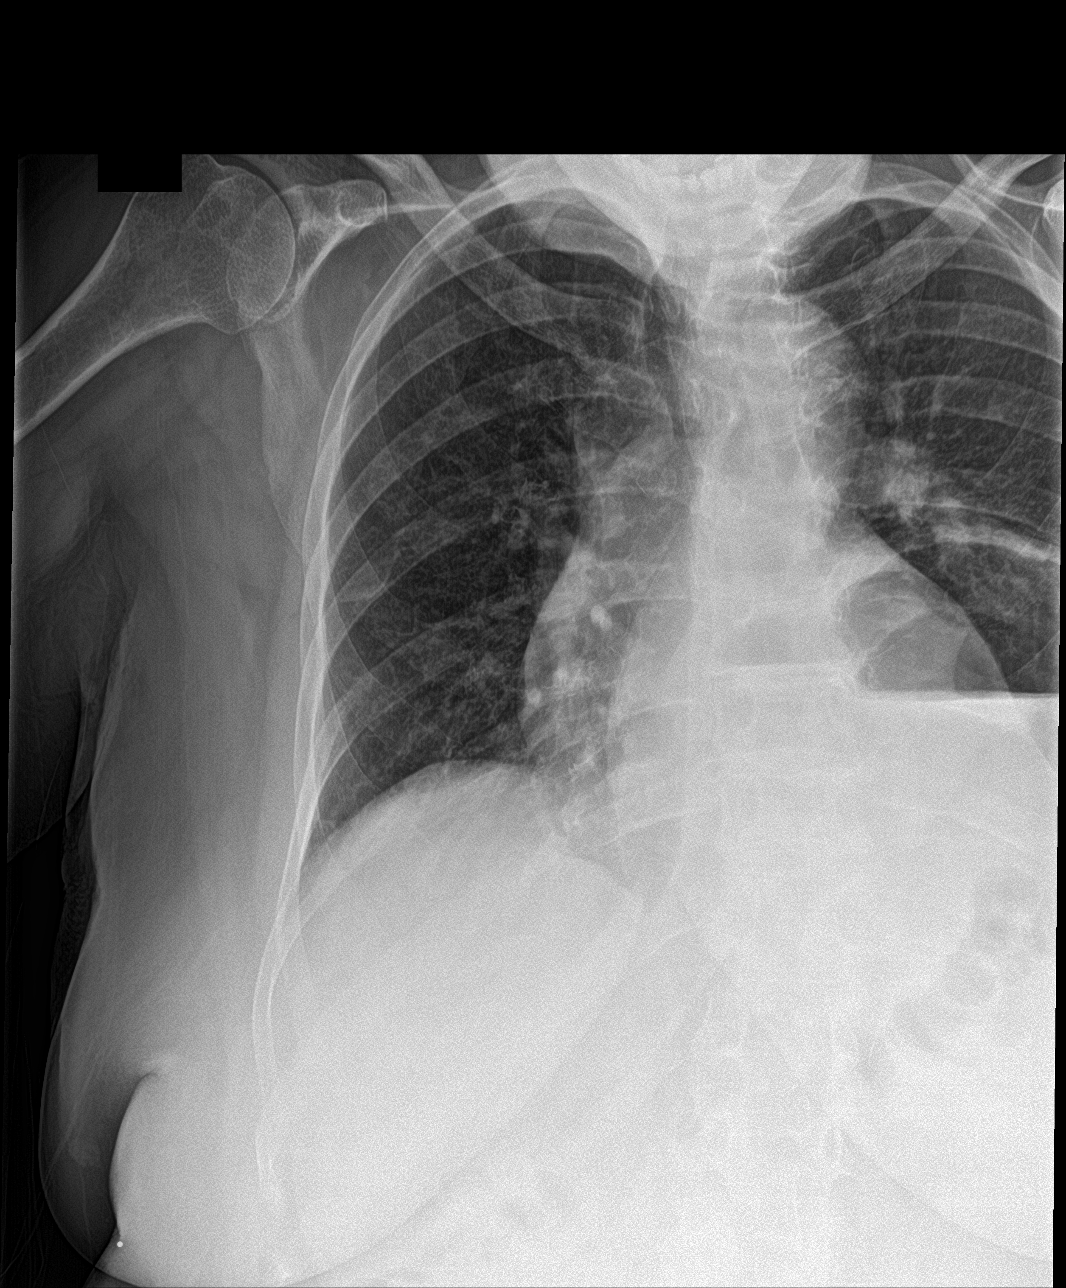
[im 2/2]
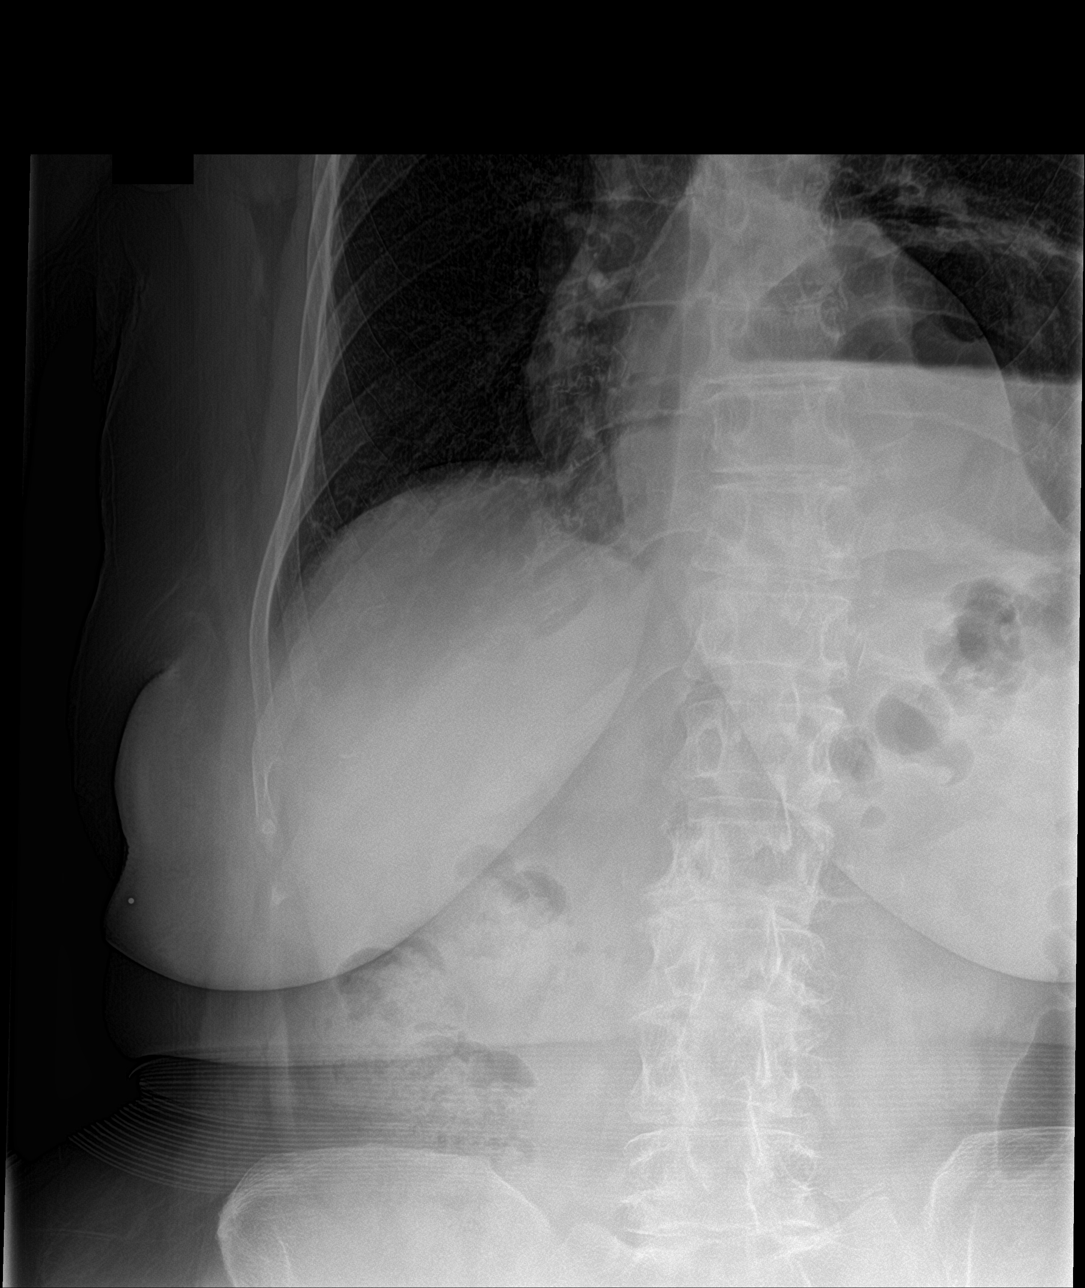

[rib pa obl]
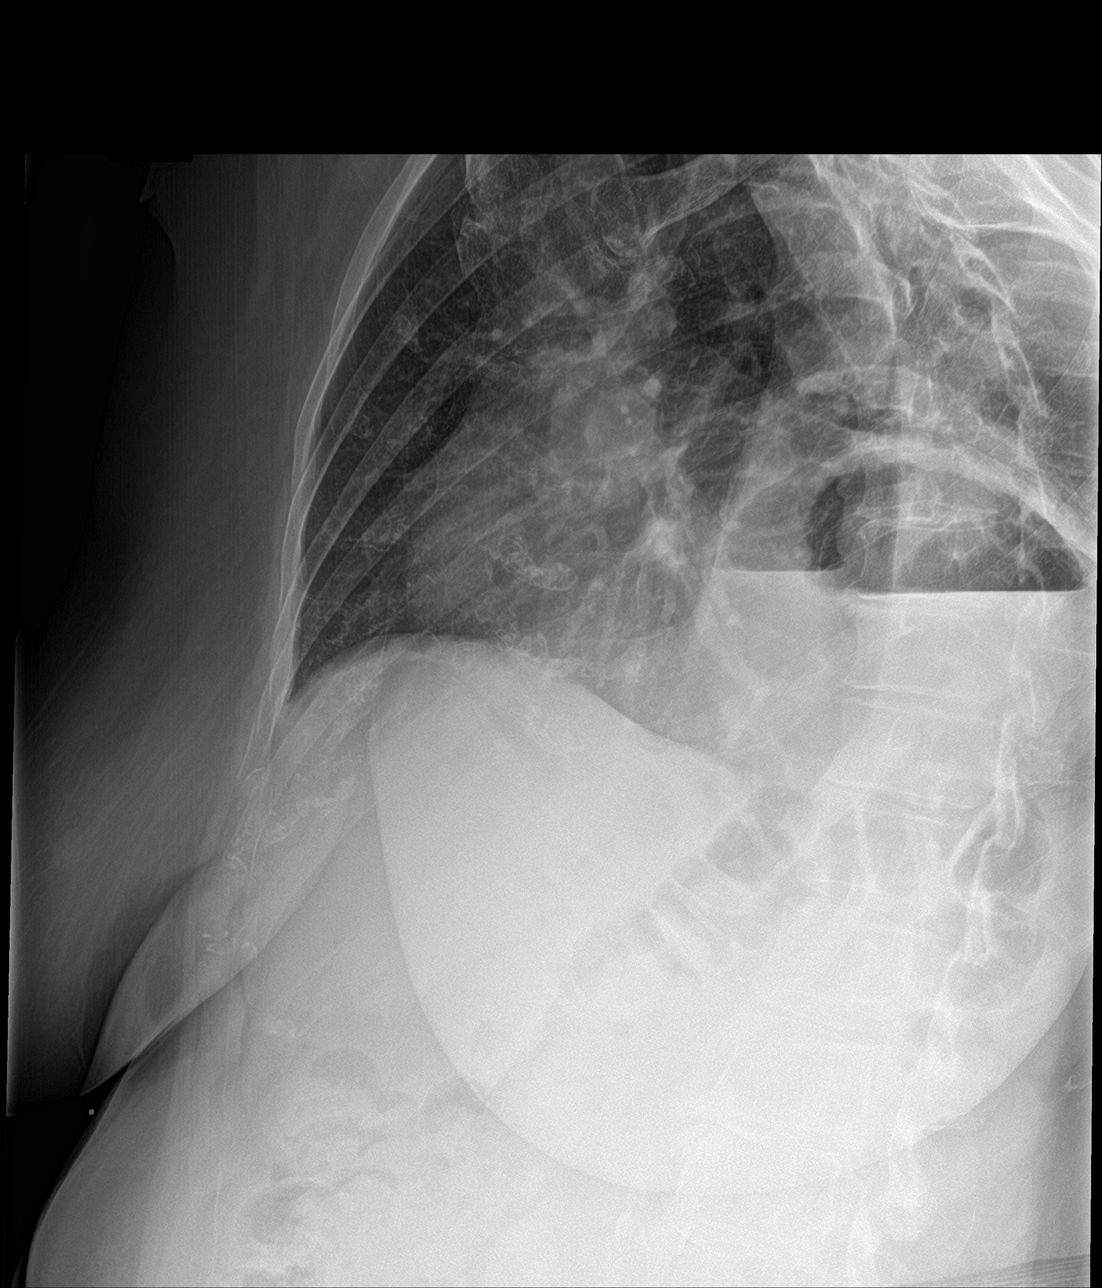

[4 of 4 positions shown; findings below may reference images not displayed]

FINDINGS: No fracture or other bone lesions are seen involving the ribs. There
is no evidence of pneumothorax or pleural effusion. Both lungs are
clear. Heart size and mediastinal contours are within normal limits.
Large hiatal hernia is noted.
IMPRESSION: Normal right ribs.  Large hiatal hernia.

## 2020-10-14 DIAGNOSIS — J329 Chronic sinusitis, unspecified: Secondary | ICD-10-CM | POA: Diagnosis not present

## 2020-10-14 DIAGNOSIS — H6980 Other specified disorders of Eustachian tube, unspecified ear: Secondary | ICD-10-CM | POA: Diagnosis not present

## 2020-10-14 DIAGNOSIS — J310 Chronic rhinitis: Secondary | ICD-10-CM | POA: Diagnosis not present

## 2020-10-14 DIAGNOSIS — Z1152 Encounter for screening for COVID-19: Secondary | ICD-10-CM | POA: Diagnosis not present

## 2020-10-24 DIAGNOSIS — H6982 Other specified disorders of Eustachian tube, left ear: Secondary | ICD-10-CM | POA: Diagnosis not present

## 2020-12-20 DIAGNOSIS — H60509 Unspecified acute noninfective otitis externa, unspecified ear: Secondary | ICD-10-CM | POA: Diagnosis not present

## 2020-12-31 DIAGNOSIS — E785 Hyperlipidemia, unspecified: Secondary | ICD-10-CM | POA: Diagnosis not present

## 2020-12-31 DIAGNOSIS — I8003 Phlebitis and thrombophlebitis of superficial vessels of lower extremities, bilateral: Secondary | ICD-10-CM | POA: Diagnosis not present

## 2020-12-31 DIAGNOSIS — N189 Chronic kidney disease, unspecified: Secondary | ICD-10-CM | POA: Diagnosis not present

## 2020-12-31 DIAGNOSIS — R5383 Other fatigue: Secondary | ICD-10-CM | POA: Diagnosis not present

## 2021-01-07 DIAGNOSIS — Z23 Encounter for immunization: Secondary | ICD-10-CM | POA: Diagnosis not present

## 2021-01-07 DIAGNOSIS — N1832 Chronic kidney disease, stage 3b: Secondary | ICD-10-CM | POA: Diagnosis not present

## 2021-01-07 DIAGNOSIS — Z0000 Encounter for general adult medical examination without abnormal findings: Secondary | ICD-10-CM | POA: Diagnosis not present

## 2021-01-07 DIAGNOSIS — E785 Hyperlipidemia, unspecified: Secondary | ICD-10-CM | POA: Diagnosis not present

## 2021-06-22 DIAGNOSIS — R6883 Chills (without fever): Secondary | ICD-10-CM | POA: Diagnosis not present

## 2021-06-22 DIAGNOSIS — R059 Cough, unspecified: Secondary | ICD-10-CM | POA: Diagnosis not present

## 2021-06-22 DIAGNOSIS — R509 Fever, unspecified: Secondary | ICD-10-CM | POA: Diagnosis not present

## 2021-06-22 DIAGNOSIS — U071 COVID-19: Secondary | ICD-10-CM | POA: Diagnosis not present

## 2021-08-19 DIAGNOSIS — E785 Hyperlipidemia, unspecified: Secondary | ICD-10-CM | POA: Diagnosis not present

## 2021-08-19 DIAGNOSIS — N1832 Chronic kidney disease, stage 3b: Secondary | ICD-10-CM | POA: Diagnosis not present

## 2021-08-29 DIAGNOSIS — N1832 Chronic kidney disease, stage 3b: Secondary | ICD-10-CM | POA: Diagnosis not present

## 2021-08-29 DIAGNOSIS — R0602 Shortness of breath: Secondary | ICD-10-CM | POA: Diagnosis not present

## 2021-08-29 DIAGNOSIS — E785 Hyperlipidemia, unspecified: Secondary | ICD-10-CM | POA: Diagnosis not present

## 2021-08-29 DIAGNOSIS — R5383 Other fatigue: Secondary | ICD-10-CM | POA: Diagnosis not present

## 2021-09-30 DIAGNOSIS — J020 Streptococcal pharyngitis: Secondary | ICD-10-CM | POA: Diagnosis not present

## 2022-01-05 DIAGNOSIS — E785 Hyperlipidemia, unspecified: Secondary | ICD-10-CM | POA: Diagnosis not present

## 2022-01-05 DIAGNOSIS — N1832 Chronic kidney disease, stage 3b: Secondary | ICD-10-CM | POA: Diagnosis not present

## 2022-01-05 DIAGNOSIS — R5383 Other fatigue: Secondary | ICD-10-CM | POA: Diagnosis not present

## 2022-02-18 DIAGNOSIS — H6502 Acute serous otitis media, left ear: Secondary | ICD-10-CM | POA: Diagnosis not present

## 2022-03-02 DIAGNOSIS — N1832 Chronic kidney disease, stage 3b: Secondary | ICD-10-CM | POA: Diagnosis not present

## 2022-03-02 DIAGNOSIS — E785 Hyperlipidemia, unspecified: Secondary | ICD-10-CM | POA: Diagnosis not present

## 2022-03-02 DIAGNOSIS — R5383 Other fatigue: Secondary | ICD-10-CM | POA: Diagnosis not present

## 2022-03-02 DIAGNOSIS — Z0000 Encounter for general adult medical examination without abnormal findings: Secondary | ICD-10-CM | POA: Diagnosis not present
